# Patient Record
Sex: Female | Born: 1965 | Race: White | Hispanic: No | Marital: Married | Smoking: Never smoker
Health system: Southern US, Community
[De-identification: ages and names within clinical notes are randomized; demographics above are authoritative.]

---

## 2008-01-21 ENCOUNTER — Encounter: Admission: RE | Admit: 2008-01-21 | Discharge: 2008-01-21 | Payer: Self-pay | Admitting: Obstetrics and Gynecology

## 2008-01-31 ENCOUNTER — Encounter: Admission: RE | Admit: 2008-01-31 | Discharge: 2008-01-31 | Payer: Self-pay | Admitting: Family Medicine

## 2012-12-12 ENCOUNTER — Ambulatory Visit: Payer: BC Managed Care – PPO | Admitting: Family Medicine

## 2012-12-12 ENCOUNTER — Ambulatory Visit: Payer: BC Managed Care – PPO

## 2012-12-12 VITALS — BP 130/82 | HR 100 | Temp 98.7°F | Resp 18 | Ht 64.25 in | Wt 188.0 lb

## 2012-12-12 DIAGNOSIS — S93601A Unspecified sprain of right foot, initial encounter: Secondary | ICD-10-CM

## 2012-12-12 DIAGNOSIS — S8990XA Unspecified injury of unspecified lower leg, initial encounter: Secondary | ICD-10-CM

## 2012-12-12 DIAGNOSIS — S93609A Unspecified sprain of unspecified foot, initial encounter: Secondary | ICD-10-CM

## 2012-12-12 DIAGNOSIS — S99921A Unspecified injury of right foot, initial encounter: Secondary | ICD-10-CM

## 2012-12-12 MED ORDER — IBUPROFEN 600 MG PO TABS: 600.0000 mg | ORAL_TABLET | Freq: Three times a day (TID) | ORAL | Status: DC | PRN

## 2012-12-12 MED ORDER — TRAMADOL HCL 50 MG PO TABS: 50.0000 mg | ORAL_TABLET | Freq: Three times a day (TID) | ORAL | Status: DC | PRN

## 2012-12-12 NOTE — Patient Instructions (Signed)
Foot Sprain °The muscles and cord like structures which attach muscle to bone (tendons) that surround the feet are made up of units. A foot sprain can occur at the weakest spot in any of these units. This condition is most often caused by injury to or overuse of the foot, as from playing contact sports, or aggravating a previous injury, or from poor conditioning, or obesity. °SYMPTOMS °· Pain with movement of the foot. °· Tenderness and swelling at the injury site. °· Loss of strength is present in moderate or severe sprains. °THE THREE GRADES OR SEVERITY OF FOOT SPRAIN ARE: °· Mild (Grade I): Slightly pulled muscle without tearing of muscle or tendon fibers or loss of strength. °· Moderate (Grade II): Tearing of fibers in a muscle, tendon, or at the attachment to bone, with small decrease in strength. °· Severe (Grade III): Rupture of the muscle-tendon-bone attachment, with separation of fibers. Severe sprain requires surgical repair. Often repeating (chronic) sprains are caused by overuse. Sudden (acute) sprains are caused by direct injury or over-use. °DIAGNOSIS  °Diagnosis of this condition is usually by your own observation. If problems continue, a caregiver may be required for further evaluation and treatment. X-rays may be required to make sure there are not breaks in the bones (fractures) present. Continued problems may require physical therapy for treatment. °PREVENTION °· Use strength and conditioning exercises appropriate for your sport. °· Warm up properly prior to working out. °· Use athletic shoes that are made for the sport you are participating in. °· Allow adequate time for healing. Early return to activities makes repeat injury more likely, and can lead to an unstable arthritic foot that can result in prolonged disability. Mild sprains generally heal in 3 to 10 days, with moderate and severe sprains taking 2 to 10 weeks. Your caregiver can help you determine the proper time required for  healing. °HOME CARE INSTRUCTIONS  °· Apply ice to the injury for 15-20 minutes, 3-4 times per day. Put the ice in a plastic bag and place a towel between the bag of ice and your skin. °· An elastic wrap (like an Ace bandage) may be used to keep swelling down. °· Keep foot above the level of the heart, or at least raised on a footstool, when swelling and pain are present. °· Try to avoid use other than gentle range of motion while the foot is painful. Do not resume use until instructed by your caregiver. Then begin use gradually, not increasing use to the point of pain. If pain does develop, decrease use and continue the above measures, gradually increasing activities that do not cause discomfort, until you gradually achieve normal use. °· Use crutches if and as instructed, and for the length of time instructed. °· Keep injured foot and ankle wrapped between treatments. °· Massage foot and ankle for comfort and to keep swelling down. Massage from the toes up towards the knee. °· Only take over-the-counter or prescription medicines for pain, discomfort, or fever as directed by your caregiver. °SEEK IMMEDIATE MEDICAL CARE IF:  °· Your pain and swelling increase, or pain is not controlled with medications. °· You have loss of feeling in your foot or your foot turns cold or blue. °· You develop new, unexplained symptoms, or an increase of the symptoms that brought you to your caregiver. °MAKE SURE YOU:  °· Understand these instructions. °· Will watch your condition. °· Will get help right away if you are not doing well or get worse. °Document Released:   08/26/2001 Document Revised: 05/29/2011 Document Reviewed: 10/24/2007 °[employer]® Patient Information ©2014 [employer]. ° °

## 2012-12-12 NOTE — Progress Notes (Signed)
  Subjective:    Patient ID: Sarah Bradshaw, female    DOB: [birth year], 47 y.o.   MRN: [id number]   Chief Complaint  Patient presents with  . Foot Injury    right foot injury yesterday     HPI  Ran into bed frame yesterday with right toes.  Having a lot of pain over 2nd - 4th toe.  Was hobbling yesterday and actually crawling last night - still hobbling but can't put any pressure on the ball of her foot. Has been elevating foot but no nsaids or ice.  History reviewed. No pertinent past medical history. No current outpatient prescriptions on file prior to visit.   No current facility-administered medications on file prior to visit.   No Known Allergies  Review of Systems  Constitutional: Positive for activity change. Negative for fever, chills and unexpected weight change.  Musculoskeletal: Positive for joint swelling, arthralgias and gait problem. Negative for myalgias and back pain.  Skin: Positive for color change. Negative for rash.  Neurological: Negative for weakness and numbness.  Psychiatric/Behavioral: Positive for sleep disturbance.      BP 130/82  Pulse 100  Temp(Src) 98.7 F (37.1 C) (Oral)  Resp 18  Ht 5' 4.25" (1.632 m)  Wt 188 lb (85.276 kg)  BMI 32.02 kg/m2  SpO2 100%  LMP 12/02/2012 Objective:   Physical Exam  Constitutional: She is oriented to person, place, and time. She appears well-developed and well-nourished. No distress.  HENT:  Head: Normocephalic and atraumatic.  Right Ear: External ear normal.  Eyes: Conjunctivae are normal. No scleral icterus.  Cardiovascular:  Pulses:      Dorsalis pedis pulses are 2+ on the right side.       Posterior tibial pulses are 2+ on the right side.  Pulmonary/Chest: Effort normal.  Musculoskeletal:       Right ankle: Normal. She exhibits normal range of motion and no swelling. No tenderness. No head of 5th metatarsal and no proximal fibula tenderness found. Achilles tendon normal.       Right foot: She exhibits  decreased range of motion, tenderness, bony tenderness and swelling. She exhibits normal capillary refill, no crepitus, no deformity and no laceration.       Feet:  Neurological: She is alert and oriented to person, place, and time.  Skin: Skin is warm and dry. She is not diaphoretic. No erythema.  Psychiatric: She has a normal mood and affect. Her behavior is normal.   [facility] reading (PRIMARY) by  Dr. Clelia Croft.    Right foot xray: No acute bony abnormality seen. Assessment & Plan:  Foot injury, right, initial encounter - Plan: DG Foot Complete Right  Foot sprain, right, initial encounter RICE x 2-3d, wear post-op shoe x 1 wk, then transition to shoe with firm thick sole. RTC if worsening or still having sig pain/discomfort after 7-10d. Meds ordered this encounter  Medications  . ibuprofen (ADVIL,MOTRIN) 600 MG tablet    Sig: Take 1 tablet (600 mg total) by mouth every 8 (eight) hours as needed for pain.    Dispense:  30 tablet    Refill:  0  . traMADol (ULTRAM) 50 MG tablet    Sig: Take 1 tablet (50 mg total) by mouth every 8 (eight) hours as needed for pain.    Dispense:  30 tablet    Refill:  0

## 2015-03-22 ENCOUNTER — Emergency Department (HOSPITAL_COMMUNITY)
Admission: EM | Admit: 2015-03-22 | Discharge: 2015-03-22 | Disposition: A | Discharge status: Home / Self Care | Payer: 59 | Attending: Emergency Medicine | Admitting: Emergency Medicine

## 2015-03-22 ENCOUNTER — Emergency Department (HOSPITAL_COMMUNITY): Payer: 59

## 2015-03-22 DIAGNOSIS — R1030 Lower abdominal pain, unspecified: Secondary | ICD-10-CM | POA: Diagnosis not present

## 2015-03-22 DIAGNOSIS — N939 Abnormal uterine and vaginal bleeding, unspecified: Secondary | ICD-10-CM | POA: Insufficient documentation

## 2015-03-22 LAB — COMPREHENSIVE METABOLIC PANEL
ALT: 13 U/L — ABNORMAL LOW (ref 14–54)
AST: 17 U/L (ref 15–41)
Albumin: 4.2 g/dL (ref 3.5–5.0)
Alkaline Phosphatase: 120 U/L (ref 38–126)
Anion gap: 11 (ref 5–15)
BUN: 11 mg/dL (ref 6–20)
CHLORIDE: 103 mmol/L (ref 101–111)
CO2: 22 mmol/L (ref 22–32)
Calcium: 8.8 mg/dL — ABNORMAL LOW (ref 8.9–10.3)
Creatinine, Ser: 0.66 mg/dL (ref 0.44–1.00)
Glucose, Bld: 108 mg/dL — ABNORMAL HIGH (ref 65–99)
POTASSIUM: 3.8 mmol/L (ref 3.5–5.1)
SODIUM: 136 mmol/L (ref 135–145)
Total Bilirubin: 0.9 mg/dL (ref 0.3–1.2)
Total Protein: 7.7 g/dL (ref 6.5–8.1)

## 2015-03-22 LAB — CBC
HEMATOCRIT: 40.4 % (ref 36.0–46.0)
Hemoglobin: 13.9 g/dL (ref 12.0–15.0)
MCH: 30.8 pg (ref 26.0–34.0)
MCHC: 34.4 g/dL (ref 30.0–36.0)
MCV: 89.6 fL (ref 78.0–100.0)
Platelets: 286 10*3/uL (ref 150–400)
RBC: 4.51 MIL/uL (ref 3.87–5.11)
RDW: 13.1 % (ref 11.5–15.5)
WBC: 15 10*3/uL — AB (ref 4.0–10.5)

## 2015-03-22 LAB — WET PREP, GENITAL
Clue Cells Wet Prep HPF POC: NONE SEEN
Sperm: NONE SEEN
TRICH WET PREP: NONE SEEN
YEAST WET PREP: NONE SEEN

## 2015-03-22 LAB — URINALYSIS, ROUTINE W REFLEX MICROSCOPIC
GLUCOSE, UA: NEGATIVE mg/dL
Ketones, ur: NEGATIVE mg/dL
Nitrite: NEGATIVE
PH: 7.5 (ref 5.0–8.0)
Protein, ur: NEGATIVE mg/dL
Specific Gravity, Urine: 1.023 (ref 1.005–1.030)

## 2015-03-22 LAB — URINE MICROSCOPIC-ADD ON

## 2015-03-22 LAB — LIPASE, BLOOD: LIPASE: 26 U/L (ref 11–51)

## 2015-03-22 LAB — PROTIME-INR
INR: 1.04 (ref 0.00–1.49)
PROTHROMBIN TIME: 13.8 s (ref 11.6–15.2)

## 2015-03-22 LAB — APTT: APTT: 28 s (ref 24–37)

## 2015-03-22 LAB — I-STAT BETA HCG BLOOD, ED (MC, WL, AP ONLY): I-stat hCG, quantitative: <5 m[IU]/mL (ref <5)

## 2015-03-22 MED ORDER — OXYCODONE-ACETAMINOPHEN 5-325 MG PO TABS
1.0000 | ORAL_TABLET | ORAL | Status: AC | PRN
Start: 2015-03-22 — End: ?

## 2015-03-22 MED ORDER — ONDANSETRON 4 MG PO TBDP: 4.0000 mg | ORAL_TABLET | Freq: Three times a day (TID) | ORAL | Status: AC | PRN

## 2015-03-22 MED ORDER — HYDROMORPHONE HCL 1 MG/ML IJ SOLN
1.0000 mg | Freq: Once | INTRAMUSCULAR | Status: AC
  Administered 2015-03-22: 1 mg via INTRAVENOUS
  Filled 2015-03-22: qty 1

## 2015-03-22 MED ORDER — IBUPROFEN 800 MG PO TABS: 800.0000 mg | ORAL_TABLET | Freq: Three times a day (TID) | ORAL | Status: AC

## 2015-03-22 MED ORDER — MORPHINE SULFATE (PF) 4 MG/ML IV SOLN
4.0000 mg | Freq: Once | INTRAVENOUS | Status: AC
  Administered 2015-03-22: 4 mg via INTRAVENOUS
  Filled 2015-03-22: qty 1

## 2015-03-22 NOTE — ED Notes (Signed)
Pt c/o lower abdominal cramping onset 2 days ago, states she has been on her menstrual cycle x 2 weeks, heavy bleeding, changing heavy tampon every 0.5-1 hour, blood clots.

## 2015-03-22 NOTE — Discharge Instructions (Signed)
Take the prescribed medication as directed. Follow-up with an OB-GYN as soon as possible-- either call your former OB-GYN or [facility]. Return to the ED for new or worsening symptoms.

## 2015-03-22 NOTE — ED Provider Notes (Signed)
CSN: [id number]     Arrival date & time 03/22/15  0930 History   First MD Initiated Contact with Patient 03/22/15 1019     Chief Complaint  Patient presents with  . Vaginal Bleeding     (Consider location/radiation/quality/duration/timing/severity/associated sxs/prior Treatment) Patient is a 50 y.o. female presenting with vaginal bleeding. The history is provided by the patient and medical records.  Vaginal Bleeding Associated symptoms: abdominal pain     50 year old female with no significant past medical history presenting to the ED for vaginal bleeding. Patient states she had her menstrual cycle last week which was normal, however began having very heavy bleeding for the past 48 hours. She states she isn't changing her tampon every 1-2 hours and has had dark red blood clots in her bleeding as well.  She states she is also having very severe abdominal cramping which she states feels like contractions.  She denies nausea, vomiting, diarrhea.  States this is very abdominal for her to have irregular vaginal bleeding like this-- states her cycles are usually very regular, lasting 5-7 days with moderate flow.  States no hx of heavy vaginal bleeding or clotting disorder.  No hx of endometriosis in family.  States she thinks she was told she had uterine fibroids as a teenager.  No other OB-GYN history. Patient is established with OB-GYN in [facility], .  VSS.  No past medical history on file. No past surgical history on file. Family History  Problem Relation Age of Onset  . Hypertension Mother   . Heart disease Father   . Heart disease Brother    Social History  Substance Use Topics  . Smoking status: Never Smoker   . Smokeless tobacco: Not on file  . Alcohol Use: No   OB History    No data available     Review of Systems  Gastrointestinal: Positive for abdominal pain.  Genitourinary: Positive for vaginal bleeding.  All other systems reviewed and are negative.     Allergies   Review of patient's allergies indicates no known allergies.  Home Medications   Prior to Admission medications   Medication Sig Start Date End Date Taking? Authorizing Provider  Ibuprofen (MIDOL) 200 MG CAPS Take 400 mg by mouth every 6 (six) hours as needed (cramps/pain).   Yes Historical Provider, MD   BP 153/114 mmHg  Pulse 117  Temp(Src) 98.1 F (36.7 C) (Oral)  Resp 16  SpO2 99%   Physical Exam  Constitutional: She is oriented to person, place, and time. She appears well-developed and well-nourished.  HENT:  Head: Normocephalic and atraumatic.  Mouth/Throat: Oropharynx is clear and moist.  Eyes: Conjunctivae and EOM are normal. Pupils are equal, round, and reactive to light.  Neck: Normal range of motion. Neck supple.  Cardiovascular: Normal rate, regular rhythm and normal heart sounds.   Pulmonary/Chest: Effort normal and breath sounds normal. No respiratory distress. She has no wheezes.  Abdominal: Soft. Bowel sounds are normal. There is tenderness in the suprapubic area. There is no CVA tenderness.  TTP in suprapubic region, voluntary guarding noted  Genitourinary: There is no lesion on the right labia. There is no lesion on the left labia. There is bleeding in the vagina. No foreign body around the vagina. No vaginal discharge found.  Normal female external genitalia without visible lesions; moderate amount of vaginal bleeding with very small clots noted; cervical os closed; no adnexal or CMT  Musculoskeletal: Normal range of motion.  Neurological: She is alert and oriented to  person, place, and time.  Skin: Skin is warm and dry.  Psychiatric: She has a normal mood and affect.  Nursing note and vitals reviewed.   ED Course  Procedures (including critical care time) Labs Review Labs Reviewed  WET PREP, GENITAL - Abnormal; Notable for the following:    WBC, Wet Prep HPF POC FEW (*)    All other components within normal limits  COMPREHENSIVE METABOLIC PANEL -  Abnormal; Notable for the following:    Glucose, Bld 108 (*)    Calcium 8.8 (*)    ALT 13 (*)    All other components within normal limits  CBC - Abnormal; Notable for the following:    WBC 15.0 (*)    All other components within normal limits  URINALYSIS, ROUTINE W REFLEX MICROSCOPIC (NOT AT [facility]) - Abnormal; Notable for the following:    Color, Urine AMBER (*)    APPearance CLOUDY (*)    Hgb urine dipstick LARGE (*)    Bilirubin Urine SMALL (*)    Leukocytes, UA SMALL (*)    All other components within normal limits  URINE MICROSCOPIC-ADD ON - Abnormal; Notable for the following:    Squamous Epithelial / LPF 0-5 (*)    Bacteria, UA RARE (*)    All other components within normal limits  LIPASE, BLOOD  PROTIME-INR  APTT  I-STAT BETA HCG BLOOD, ED ([facility], [facility], [facility] ONLY)  GC/CHLAMYDIA PROBE AMP ([place]) NOT AT [facility]    Imaging Review [place] Transvaginal Non-ob  03/22/2015  CLINICAL DATA:  Heavy bleeding and pelvic pain since yesterday. EXAM: TRANSABDOMINAL AND TRANSVAGINAL ULTRASOUND OF PELVIS DOPPLER ULTRASOUND OF OVARIES TECHNIQUE: Both transabdominal and transvaginal ultrasound examinations of the pelvis were performed. Transabdominal technique was performed for global imaging of the pelvis including uterus, ovaries, adnexal regions, and pelvic cul-de-sac. It was necessary to proceed with endovaginal exam following the transabdominal exam to visualize the endometrium. Color and duplex Doppler ultrasound was utilized to evaluate blood flow to the ovaries. COMPARISON:  None. FINDINGS: Uterus Measurements: 11.9 x 6 x 6 x 7.3 cm. Myometrium is heterogeneous raising the question of adenomyosis. No evidence for focal fibroid. Endometrium Thickness: Heterogeneous and 15 mm in thickness. No focal abnormality visualized. Right ovary Measurements: Poorly visualized but approximately 3.1 x 2.2 x 2.0 cm. No adnexal mass. Left ovary Measurements: Not definitely visualized. Pulsed Doppler evaluation of  ovaries demonstrates normal low resistance arterial and venous waveforms in the right ovary. Left ovary not well seen. Other findings Trace intraperitoneal free fluid. IMPRESSION: Thickened heterogeneous endometrium may be related to blood products. No evidence for adnexal mass with poor visualization of left ovary. Electronically Signed   By: Kennith CenterEric  Mansell M.D.   On: 03/22/2015 13:25   [place] Pelvis Complete  03/22/2015  CLINICAL DATA:  Heavy bleeding and pelvic pain since yesterday. EXAM: TRANSABDOMINAL AND TRANSVAGINAL ULTRASOUND OF PELVIS DOPPLER ULTRASOUND OF OVARIES TECHNIQUE: Both transabdominal and transvaginal ultrasound examinations of the pelvis were performed. Transabdominal technique was performed for global imaging of the pelvis including uterus, ovaries, adnexal regions, and pelvic cul-de-sac. It was necessary to proceed with endovaginal exam following the transabdominal exam to visualize the endometrium. Color and duplex Doppler ultrasound was utilized to evaluate blood flow to the ovaries. COMPARISON:  None. FINDINGS: Uterus Measurements: 11.9 x 6 x 6 x 7.3 cm. Myometrium is heterogeneous raising the question of adenomyosis. No evidence for focal fibroid. Endometrium Thickness: Heterogeneous and 15 mm in thickness. No focal abnormality visualized. Right ovary Measurements: Poorly  visualized but approximately 3.1 x 2.2 x 2.0 cm. No adnexal mass. Left ovary Measurements: Not definitely visualized. Pulsed Doppler evaluation of ovaries demonstrates normal low resistance arterial and venous waveforms in the right ovary. Left ovary not well seen. Other findings Trace intraperitoneal free fluid. IMPRESSION: Thickened heterogeneous endometrium may be related to blood products. No evidence for adnexal mass with poor visualization of left ovary. Electronically Signed   By: Kennith Center M.D.   On: 03/22/2015 13:25   [place] Art/ven Flow Abd Pelv Doppler  03/22/2015  CLINICAL DATA:  Heavy bleeding and pelvic pain  since yesterday. EXAM: TRANSABDOMINAL AND TRANSVAGINAL ULTRASOUND OF PELVIS DOPPLER ULTRASOUND OF OVARIES TECHNIQUE: Both transabdominal and transvaginal ultrasound examinations of the pelvis were performed. Transabdominal technique was performed for global imaging of the pelvis including uterus, ovaries, adnexal regions, and pelvic cul-de-sac. It was necessary to proceed with endovaginal exam following the transabdominal exam to visualize the endometrium. Color and duplex Doppler ultrasound was utilized to evaluate blood flow to the ovaries. COMPARISON:  None. FINDINGS: Uterus Measurements: 11.9 x 6 x 6 x 7.3 cm. Myometrium is heterogeneous raising the question of adenomyosis. No evidence for focal fibroid. Endometrium Thickness: Heterogeneous and 15 mm in thickness. No focal abnormality visualized. Right ovary Measurements: Poorly visualized but approximately 3.1 x 2.2 x 2.0 cm. No adnexal mass. Left ovary Measurements: Not definitely visualized. Pulsed Doppler evaluation of ovaries demonstrates normal low resistance arterial and venous waveforms in the right ovary. Left ovary not well seen. Other findings Trace intraperitoneal free fluid. IMPRESSION: Thickened heterogeneous endometrium may be related to blood products. No evidence for adnexal mass with poor visualization of left ovary. Electronically Signed   By: Kennith Center M.D.   On: 03/22/2015 13:25   I have personally reviewed and evaluated these images and lab results as part of my medical decision-making.   EKG Interpretation None      MDM   Final diagnoses:  Vaginal bleeding   50 year old female here with heavy vaginal bleeding. Menstrual cycle last week, however bleeding continued. No history of the same, but believes she was told she had uterine fibroids as a teenager. Patient is afebrile, nontoxic. On exam she has moderate amount of vaginal bleeding with small clots intermixed. Her cervical os is closed. She has no adnexal or cervical  motion tenderness. She does have suprapubic tenderness on external exam. Her lab work is reassuring, H&H stable. Coags are within normal limits. Given new onset of abnormal menstrual cycles, ultrasound was obtained which is concerning for adenomyosis.  There are no noted masses or ovarian abnormalities. After pain medication here in the emergency department, patient is much more comfortable. She states she does have some mild cramping still, however this is tolerable. Patient remains hemodynamically stable. Will discharge home with OB/GYN follow-up-- instructed she may see her former OB-GYN or may be see at [facility] hospital.  Rx Percocet, Motrin, and Zofran.  Discussed plan with patient, he/she acknowledged understanding and agreed with plan of care.  Return precautions given for new or worsening symptoms.  Garlon Hatchet, PA-C 03/22/15 1537  Leta Baptist, MD 03/23/15 [phone]

## 2015-03-23 LAB — GC/CHLAMYDIA PROBE AMP (~~LOC~~) NOT AT ARMC
CHLAMYDIA, DNA PROBE: NEGATIVE
NEISSERIA GONORRHEA: NEGATIVE

## 2015-04-08 ENCOUNTER — Ambulatory Visit (INDEPENDENT_AMBULATORY_CARE_PROVIDER_SITE_OTHER): Payer: Self-pay | Admitting: Obstetrics & Gynecology

## 2015-04-08 ENCOUNTER — Other Ambulatory Visit (HOSPITAL_COMMUNITY)
Admission: RE | Admit: 2015-04-08 | Discharge: 2015-04-08 | Disposition: A | Discharge status: Home / Self Care | Payer: 59 | Source: Ambulatory Visit | Attending: Obstetrics & Gynecology | Admitting: Obstetrics & Gynecology

## 2015-04-08 ENCOUNTER — Encounter: Payer: Self-pay | Admitting: Obstetrics & Gynecology

## 2015-04-08 VITALS — BP 138/95 | HR 91 | Temp 98.0°F | Wt 218.6 lb

## 2015-04-08 DIAGNOSIS — N939 Abnormal uterine and vaginal bleeding, unspecified: Secondary | ICD-10-CM

## 2015-04-08 DIAGNOSIS — Z124 Encounter for screening for malignant neoplasm of cervix: Secondary | ICD-10-CM

## 2015-04-08 DIAGNOSIS — N945 Secondary dysmenorrhea: Secondary | ICD-10-CM | POA: Insufficient documentation

## 2015-04-08 DIAGNOSIS — Z1151 Encounter for screening for human papillomavirus (HPV): Secondary | ICD-10-CM

## 2015-04-08 DIAGNOSIS — Z01419 Encounter for gynecological examination (general) (routine) without abnormal findings: Secondary | ICD-10-CM

## 2015-04-08 MED ORDER — NORETHIN-ETH ESTRAD-FE BIPHAS 1 MG-10 MCG / 10 MCG PO TABS: 1.0000 | ORAL_TABLET | Freq: Every day | ORAL | Status: DC

## 2015-04-08 NOTE — Progress Notes (Signed)
Patient ID: Sarah Bradshaw, female   DOB: 09-03-1965, 50 y.o.   MRN: [id number] History:  50 y.o. [id number] LNMP 1 1/2 year prev.  LMP 02/2015. Menses have been heavy and painful for 1 1/2 years.  Cycles occur every 4-5 weeks.  Cycles last 7 days.  Getting worse. She had a cycle for 3 weeks in Dec.  Pt reports using a super tampon and a pad at times.  On regular days pt reports using tampon and changing every 2 hours or so.  Pt reports passing of clots that are larger than a quarter.     Took OCPs prev with no ill effects.  Nonsmoker.  Told in the ED that she had endometriosis.  Pain worse with passage of large clots. Did not have pain prior to 1 1/2 years prev.  The following portions of the patient's history were reviewed and updated as appropriate: allergies, current medications, past family history, past medical history, past social history, past surgical history and problem list.  Social History   Social History  . Marital Status: Widowed    Spouse Name: N/A  . Number of Children: N/A  . Years of Education: N/A   Occupational History  . Not on file.   Social History Main Topics  . Smoking status: Never Smoker   . Smokeless tobacco: Not on file  . Alcohol Use: No  . Drug Use: No  . Sexual Activity: Not on file   Other Topics Concern  . Not on file   Social History Narrative      Review of Systems:  Pertinent items are noted in HPI.  Objective:  Physical Exam Blood pressure 138/95, pulse 91, temperature 98 F (36.7 C), weight 218 lb 9.6 oz (99.156 kg), last menstrual period 03/14/2015. Gen: NAD Abd: Soft, nontender and nondistended Pelvic: Normal appearing external genitalia; normal appearing vaginal mucosa and cervix.  No blood in vault. Normal discharge.  Small uterus, no other palpable masses, no uterine or adnexal tenderness  The indications for endometrial biopsy were reviewed.   Risks of the biopsy including cramping, bleeding, infection, uterine perforation, inadequate  specimen and need for additional procedures  were discussed. The patient states she understands and agrees to undergo procedure today. Consent was signed. Time out was performed. Urine HCG was negative. A sterile speculum was placed in the patient's vagina and the cervix was prepped with Betadine. A single-toothed tenaculum was placed on the anterior lip of the cervix to stabilize it. The 3 mm pipelle was introduced into the endometrial cavity without difficulty to a depth of 10cm, and a moderate amount of tissue was obtained and sent to pathology. The instruments were removed from the patient's vagina. Minimal bleeding from the cervix was noted.  The patient tolerated the procedure well.   Labs and Imaging US Transvaginal Non-ob  03/22/2015  CLINICAL DATA:  Heavy bleeding and pelvic pain since yesterday. EXAM: TRANSABDOMINAL AND TRANSVAGINAL ULTRASOUND OF PELVIS DOPPLER ULTRASOUND OF OVARIES TECHNIQUE: Both transabdominal and transvaginal ultrasound examinations of the pelvis were performed. Transabdominal technique was performed for global imaging of the pelvis including uterus, ovaries, adnexal regions, and pelvic cul-de-sac. It was necessary to proceed with endovaginal exam following the transabdominal exam to visualize the endometrium. Color and duplex Doppler ultrasound was utilized to evaluate blood flow to the ovaries. COMPARISON:  None. FINDINGS: Uterus Measurements: 11.9 x 6 x 6 x 7.3 cm. Myometrium is heterogeneous raising the question of adenomyosis. No evidence for focal fibroid. Endometrium Thickness: Heterogeneous and 15  mm in thickness. No focal abnormality visualized. Right ovary Measurements: Poorly visualized but approximately 3.1 x 2.2 x 2.0 cm. No adnexal mass. Left ovary Measurements: Not definitely visualized. Pulsed Doppler evaluation of ovaries demonstrates normal low resistance arterial and venous waveforms in the right ovary. Left ovary not well seen. Other findings Trace  intraperitoneal free fluid. IMPRESSION: Thickened heterogeneous endometrium may be related to blood products. No evidence for adnexal mass with poor visualization of left ovary. Electronically Signed   By: Kennith Center M.D.   On: 03/22/2015 13:25   US Pelvis Complete  03/22/2015  CLINICAL DATA:  Heavy bleeding and pelvic pain since yesterday. EXAM: TRANSABDOMINAL AND TRANSVAGINAL ULTRASOUND OF PELVIS DOPPLER ULTRASOUND OF OVARIES TECHNIQUE: Both transabdominal and transvaginal ultrasound examinations of the pelvis were performed. Transabdominal technique was performed for global imaging of the pelvis including uterus, ovaries, adnexal regions, and pelvic cul-de-sac. It was necessary to proceed with endovaginal exam following the transabdominal exam to visualize the endometrium. Color and duplex Doppler ultrasound was utilized to evaluate blood flow to the ovaries. COMPARISON:  None. FINDINGS: Uterus Measurements: 11.9 x 6 x 6 x 7.3 cm. Myometrium is heterogeneous raising the question of adenomyosis. No evidence for focal fibroid. Endometrium Thickness: Heterogeneous and 15 mm in thickness. No focal abnormality visualized. Right ovary Measurements: Poorly visualized but approximately 3.1 x 2.2 x 2.0 cm. No adnexal mass. Left ovary Measurements: Not definitely visualized. Pulsed Doppler evaluation of ovaries demonstrates normal low resistance arterial and venous waveforms in the right ovary. Left ovary not well seen. Other findings Trace intraperitoneal free fluid. IMPRESSION: Thickened heterogeneous endometrium may be related to blood products. No evidence for adnexal mass with poor visualization of left ovary. Electronically Signed   By: Kennith Center M.D.   On: 03/22/2015 13:25   [place] Art/ven Flow Abd Pelv Doppler  03/22/2015  CLINICAL DATA:  Heavy bleeding and pelvic pain since yesterday. EXAM: TRANSABDOMINAL AND TRANSVAGINAL ULTRASOUND OF PELVIS DOPPLER ULTRASOUND OF OVARIES TECHNIQUE: Both transabdominal and  transvaginal ultrasound examinations of the pelvis were performed. Transabdominal technique was performed for global imaging of the pelvis including uterus, ovaries, adnexal regions, and pelvic cul-de-sac. It was necessary to proceed with endovaginal exam following the transabdominal exam to visualize the endometrium. Color and duplex Doppler ultrasound was utilized to evaluate blood flow to the ovaries. COMPARISON:  None. FINDINGS: Uterus Measurements: 11.9 x 6 x 6 x 7.3 cm. Myometrium is heterogeneous raising the question of adenomyosis. No evidence for focal fibroid. Endometrium Thickness: Heterogeneous and 15 mm in thickness. No focal abnormality visualized. Right ovary Measurements: Poorly visualized but approximately 3.1 x 2.2 x 2.0 cm. No adnexal mass. Left ovary Measurements: Not definitely visualized. Pulsed Doppler evaluation of ovaries demonstrates normal low resistance arterial and venous waveforms in the right ovary. Left ovary not well seen. Other findings Trace intraperitoneal free fluid. IMPRESSION: Thickened heterogeneous endometrium may be related to blood products. No evidence for adnexal mass with poor visualization of left ovary. Electronically Signed   By: Kennith Center M.D.   On: 03/22/2015 13:25    Assessment & Plan:  AUB- thought secondary to perimenopause.  Need to r/o endo ca. S/p endo bx secopndary dysmenorrhea Routine post-procedure instructions were given to the patient. The patient will follow up to review the results and for further management.     F/u PAP F/u surg PATH Loestrin 1/24 1 po q day F/u in 3 months or sooner prn   Nevah Dalal L. Harraway-Smith, M.D., [facility]

## 2015-04-08 NOTE — Patient Instructions (Addendum)
Perimenopause Perimenopause is the time when your body begins to move into the menopause (no menstrual period for 12 straight months). It is a natural process. Perimenopause can begin 2-8 years before the menopause and usually lasts for 1 year after the menopause. During this time, your ovaries may or may not produce an egg. The ovaries vary in their production of estrogen and progesterone hormones each month. This can cause irregular menstrual periods, difficulty getting pregnant, vaginal bleeding between periods, and uncomfortable symptoms. CAUSES  Irregular production of the ovarian hormones, estrogen and progesterone, and not ovulating every month.  Other causes include:  Tumor of the pituitary gland in the brain.  Medical disease that affects the ovaries.  Radiation treatment.  Chemotherapy.  Unknown causes.  Heavy smoking and excessive alcohol intake can bring on perimenopause sooner. SIGNS AND SYMPTOMS   Hot flashes.  Night sweats.  Irregular menstrual periods.  Decreased sex drive.  Vaginal dryness.  Headaches.  Mood swings.  Depression.  Memory problems.  Irritability.  Tiredness.  Weight gain.  Trouble getting pregnant.  The beginning of losing bone cells (osteoporosis).  The beginning of hardening of the arteries (atherosclerosis). DIAGNOSIS  Your health care provider will make a diagnosis by analyzing your age, menstrual history, and symptoms. He or she will do a physical exam and note any changes in your body, especially your female organs. Female hormone tests may or may not be helpful depending on the amount of female hormones you produce and when you produce them. However, other hormone tests may be helpful to rule out other problems. TREATMENT  In some cases, no treatment is needed. The decision on whether treatment is necessary during the perimenopause should be made by you and your health care provider based on how the symptoms are affecting you  and your lifestyle. Various treatments are available, such as:  Treating individual symptoms with a specific medicine for that symptom.  Herbal medicines that can help specific symptoms.  Counseling.  Group therapy. HOME CARE INSTRUCTIONS   Keep track of your menstrual periods (when they occur, how heavy they are, how long between periods, and how long they last) as well as your symptoms and when they started.  Only take over-the-counter or prescription medicines as directed by your health care provider.  Sleep and rest.  Exercise.  Eat a diet that contains calcium (good for your bones) and soy (acts like the estrogen hormone).  Do not smoke.  Avoid alcoholic beverages.  Take vitamin supplements as recommended by your health care provider. Taking vitamin E may help in certain cases.  Take calcium and vitamin D supplements to help prevent bone loss.  Group therapy is sometimes helpful.  Acupuncture may help in some cases. SEEK MEDICAL CARE IF:   You have questions about any symptoms you are having.  You need a referral to a specialist (gynecologist, psychiatrist, or psychologist). SEEK IMMEDIATE MEDICAL CARE IF:   You have vaginal bleeding.  Your period lasts longer than 8 days.  Your periods are recurring sooner than 21 days.  You have bleeding after intercourse.  You have severe depression.  You have pain when you urinate.  You have severe headaches.  You have vision problems.   This information is not intended to replace advice given to you by your health care provider. Make sure you discuss any questions you have with your health care provider.   Document Released: 04/13/2004 Document Revised: 03/27/2014 Document Reviewed: 10/03/2012 Elsevier Interactive Patient Education 2016 [employer]    Endometrial Biopsy, Care After Refer to this sheet in the next few weeks. These instructions provide you with information on caring for yourself after your  procedure. Your health care provider may also give you more specific instructions. Your treatment has been planned according to current medical practices, but problems sometimes occur. Call your health care provider if you have any problems or questions after your procedure. WHAT TO EXPECT AFTER THE PROCEDURE After your procedure, it is typical to have the following:  You may have mild cramping and a small amount of vaginal bleeding for a few days after the procedure. This is normal. HOME CARE INSTRUCTIONS  Only take over-the-counter or prescription medicine as directed by your health care provider.  Do not douche, use tampons, or have sexual intercourse until your health care provider approves.  Follow your health care provider's instructions regarding any activity restrictions, such as strenuous exercise or heavy lifting. SEEK MEDICAL CARE IF:  You have heavy bleeding or bleeding longer than 2 days after the procedure.  You have bad smelling drainage from your vagina.  You have a fever and chills.  Youhave severe lower stomach (abdominal) pain. SEEK IMMEDIATE MEDICAL CARE IF:  You have severe cramps in your stomach or back.  You pass large blood clots.  Your bleeding increases.  You become weak or lightheaded, or you pass out.   This information is not intended to replace advice given to you by your health care provider. Make sure you discuss any questions you have with your health care provider.   Document Released: 12/25/2012 Document Reviewed: 12/25/2012 Elsevier Interactive Patient Education 2016 [employer] Inc[employer]  Abnormal Uterine Bleeding Abnormal uterine bleeding can affect women at various stages in life, including teenagers, women in their reproductive years, pregnant women, and women who have reached menopause. Several kinds of uterine bleeding are considered abnormal, including:  Bleeding or spotting between periods.   Bleeding after sexual intercourse.    Bleeding that is heavier or more than normal.   Periods that last longer than usual.  Bleeding after menopause.  Many cases of abnormal uterine bleeding are minor and simple to treat, while others are more serious. Any type of abnormal bleeding should be evaluated by your health care provider. Treatment will depend on the cause of the bleeding. HOME CARE INSTRUCTIONS Monitor your condition for any changes. The following actions may help to alleviate any discomfort you are experiencing:  Avoid the use of tampons and douches as directed by your health care provider.  Change your pads frequently. You should get regular pelvic exams and Pap tests. Keep all follow-up appointments for diagnostic tests as directed by your health care provider.  SEEK MEDICAL CARE IF:   Your bleeding lasts more than 1 week.   You feel dizzy at times.  SEEK IMMEDIATE MEDICAL CARE IF:   You pass out.   You are changing pads every 15 to 30 minutes.   You have abdominal pain.  You have a fever.   You become sweaty or weak.   You are passing large blood clots from the vagina.   You start to feel nauseous and vomit. MAKE SURE YOU:   Understand these instructions.  Will watch your condition.  Will get help right away if you are not doing well or get worse.   This information is not intended to replace advice given to you by your health care provider. Make sure you discuss any questions you have with your health care provider.   Document  Released: 03/06/2005 Document Revised: 03/11/2013 Document Reviewed: 10/03/2012 Elsevier Interactive Patient Education [employer]

## 2015-04-12 LAB — CYTOLOGY - PAP

## 2015-04-14 ENCOUNTER — Telehealth: Payer: Self-pay | Admitting: General Practice

## 2015-04-14 DIAGNOSIS — N938 Other specified abnormal uterine and vaginal bleeding: Secondary | ICD-10-CM

## 2015-04-14 MED ORDER — NORETHINDRONE ACET-ETHINYL EST 1-20 MG-MCG PO TABS: 1.0000 | ORAL_TABLET | Freq: Every day | ORAL | Status: DC

## 2015-04-14 NOTE — Telephone Encounter (Signed)
Patient's pap & endo bx were negative. Patient should follow up in 3months

## 2015-04-14 NOTE — Telephone Encounter (Signed)
Patient called and left message stating she is requesting a Rx change. Patient states the medication is too expensive and was told to call us if that happened. Patient would also like to know when to start the medication & requested call back at work or cell number. Per Wynelle Bourgeois, may change to junel Rx and patient can begin at any time. Called patient on mobile number and line went straight to voicemail. Unable to leave message due to VM full. Called patient at work and she was not in at that time.

## 2015-04-16 ENCOUNTER — Encounter: Payer: Self-pay | Admitting: General Practice

## 2015-04-16 NOTE — Telephone Encounter (Signed)
Called patient, no answer- unable to leave message due to voicemail box full. Patient did not answer work number. Left message with patient's mother to call us back. Will send letter

## 2015-04-19 ENCOUNTER — Telehealth: Payer: Self-pay

## 2015-04-19 MED ORDER — NORGESTIMATE-ETH ESTRADIOL 0.25-35 MG-MCG PO TABS: 1.0000 | ORAL_TABLET | Freq: Every day | ORAL | Status: AC

## 2015-04-19 NOTE — Telephone Encounter (Signed)
Pt called requesting results. Called pt and pt informed me that the BCP that was prescribed was to expensive can she get another Rx.  Per Dr. Debroah Loop, pt can have Sprintec instead of LoEstrin.  Prescription e-prescribed.  I also informed pt normal results.  Pt stated understanding.

## 2015-06-09 ENCOUNTER — Emergency Department (INDEPENDENT_AMBULATORY_CARE_PROVIDER_SITE_OTHER)
Admission: EM | Admit: 2015-06-09 | Discharge: 2015-06-09 | Disposition: A | Discharge status: Home / Self Care | Payer: 59 | Source: Home / Self Care | Attending: Emergency Medicine | Admitting: Emergency Medicine

## 2015-06-09 ENCOUNTER — Other Ambulatory Visit (HOSPITAL_COMMUNITY)
Admission: RE | Admit: 2015-06-09 | Discharge: 2015-06-09 | Disposition: A | Discharge status: Home / Self Care | Payer: 59 | Source: Ambulatory Visit | Attending: Emergency Medicine | Admitting: Emergency Medicine

## 2015-06-09 ENCOUNTER — Encounter (HOSPITAL_COMMUNITY): Payer: Self-pay | Admitting: *Deleted

## 2015-06-09 DIAGNOSIS — J029 Acute pharyngitis, unspecified: Secondary | ICD-10-CM | POA: Insufficient documentation

## 2015-06-09 LAB — POCT RAPID STREP A: Streptococcus, Group A Screen (Direct): NEGATIVE

## 2015-06-09 MED ORDER — AMOXICILLIN 500 MG PO CAPS: 500.0000 mg | ORAL_CAPSULE | Freq: Three times a day (TID) | ORAL | Status: AC

## 2015-06-09 NOTE — ED Notes (Signed)
Pt  Reports  Symptoms  Of  sorethroat         With headache  And  Swollen  Glands         With  Symptoms  For  Several days        symptoms  Not  releived  By  otc  meds

## 2015-06-09 NOTE — Discharge Instructions (Signed)
Sore Throat °A sore throat is a painful, burning, sore, or scratchy feeling of the throat. There may be pain or tenderness when swallowing or talking. You may have other symptoms with a sore throat. These include coughing, sneezing, fever, or a swollen neck. A sore throat is often the first sign of another sickness. These sicknesses may include a cold, flu, strep throat, or an infection called mono. Most sore throats go away without medical treatment.  °HOME CARE  °· Only take medicine as told by your doctor. °· Drink enough fluids to keep your pee (urine) clear or pale yellow. °· Rest as needed. °· Try using throat sprays, lozenges, or suck on hard candy (if older than 4 years or as told). °· Sip warm liquids, such as broth, herbal tea, or warm water with honey. Try sucking on frozen ice pops or drinking cold liquids. °· Rinse the mouth (gargle) with salt water. Mix 1 teaspoon salt with 8 ounces of water. °· Do not smoke. Avoid being around others when they are smoking. °· Put a humidifier in your bedroom at night to moisten the air. You can also turn on a hot shower and sit in the bathroom for 5-10 minutes. Be sure the bathroom door is closed. °GET HELP RIGHT AWAY IF:  °· You have trouble breathing. °· You cannot swallow fluids, soft foods, or your spit (saliva). °· You have more puffiness (swelling) in the throat. °· Your sore throat does not get better in 7 days. °· You feel sick to your stomach (nauseous) and throw up (vomit). °· You have a fever or lasting symptoms for more than 2-3 days. °· You have a fever and your symptoms suddenly get worse. °MAKE SURE YOU:  °· Understand these instructions. °· Will watch your condition. °· Will get help right away if you are not doing well or get worse. °  °This information is not intended to replace advice given to you by your health care provider. Make sure you discuss any questions you have with your health care provider. °  °Document Released: 12/14/2007 Document  Revised: 11/29/2011 Document Reviewed: 11/12/2011 °Elsevier Interactive Patient Education ©2016 [employer] Inc. ° °Pharyngitis °Pharyngitis is redness, pain, and swelling (inflammation) of your pharynx.  °CAUSES  °Pharyngitis is usually caused by infection. Most of the time, these infections are from viruses (viral) and are part of a cold. However, sometimes pharyngitis is caused by bacteria (bacterial). Pharyngitis can also be caused by allergies. Viral pharyngitis may be spread from person to person by coughing, sneezing, and personal items or utensils (cups, forks, spoons, toothbrushes). Bacterial pharyngitis may be spread from person to person by more intimate contact, such as kissing.  °SIGNS AND SYMPTOMS  °Symptoms of pharyngitis include:   °· Sore throat.   °· Tiredness (fatigue).   °· Low-grade fever.   °· Headache. °· Joint pain and muscle aches. °· Skin rashes. °· Swollen lymph nodes. °· Plaque-like film on throat or tonsils (often seen with bacterial pharyngitis). °DIAGNOSIS  °Your health care provider will ask you questions about your illness and your symptoms. Your medical history, along with a physical exam, is often all that is needed to diagnose pharyngitis. Sometimes, a rapid strep test is done. Other lab tests may also be done, depending on the suspected cause.  °TREATMENT  °Viral pharyngitis will usually get better in 3-4 days without the use of medicine. Bacterial pharyngitis is treated with medicines that kill germs (antibiotics).  °HOME CARE INSTRUCTIONS  °· Drink enough water and fluids to   keep your urine clear or pale yellow.   °· Only take over-the-counter or prescription medicines as directed by your health care provider:   °¨ If you are prescribed antibiotics, make sure you finish them even if you start to feel better.   °¨ Do not take aspirin.   °· Get lots of rest.   °· Gargle with 8 oz of salt water (½ tsp of salt per 1 qt of water) as often as every 1-2 hours to soothe your throat.    °· Throat lozenges (if you are not at risk for choking) or sprays may be used to soothe your throat. °SEEK MEDICAL CARE IF:  °· You have large, tender lumps in your neck. °· You have a rash. °· You cough up green, yellow-brown, or bloody spit. °SEEK IMMEDIATE MEDICAL CARE IF:  °· Your neck becomes stiff. °· You drool or are unable to swallow liquids. °· You vomit or are unable to keep medicines or liquids down. °· You have severe pain that does not go away with the use of recommended medicines. °· You have trouble breathing (not caused by a stuffy nose). °MAKE SURE YOU:  °· Understand these instructions. °· Will watch your condition. °· Will get help right away if you are not doing well or get worse. °  °This information is not intended to replace advice given to you by your health care provider. Make sure you discuss any questions you have with your health care provider. °  °Document Released: 03/06/2005 Document Revised: 12/25/2012 Document Reviewed: 11/11/2012 °Elsevier Interactive Patient Education ©2016 [employer] Inc. ° °

## 2015-06-09 NOTE — ED Provider Notes (Signed)
CSN: [id number]     Arrival date & time 06/09/15  1418 History   First MD Initiated Contact with Patient 06/09/15 1618     Chief Complaint  Patient presents with  . Sore Throat   (Consider location/radiation/quality/duration/timing/severity/associated sxs/prior Treatment) HPI History obtained from patient:   LOCATION:throat SEVERITY:6 DURATION:since Friday CONTEXT:sudden onset QUALITY:scratchy MODIFYING FACTORS:OTC meds without relief ASSOCIATED SYMPTOMS:hurts to swallow, fever, runny nose, cough TIMING:now constant Grandbaby due any day History reviewed. No pertinent past medical history. History reviewed. No pertinent past surgical history. Family History  Problem Relation Age of Onset  . Hypertension Mother   . Heart disease Father   . Heart disease Brother    Social History  Substance Use Topics  . Smoking status: Never Smoker   . Smokeless tobacco: None  . Alcohol Use: No   OB History    No data available     Review of Systems Sore throat Allergies  Review of patient's allergies indicates no known allergies.  Home Medications   Prior to Admission medications   Medication Sig Start Date End Date Taking? Authorizing Provider  amoxicillin (AMOXIL) 500 MG capsule Take 1 capsule (500 mg total) by mouth 3 (three) times daily. 06/09/15   Tharon AquasFrank C Jeanluc Wegman, PA  ibuprofen (ADVIL,MOTRIN) 800 MG tablet Take 1 tablet (800 mg total) by mouth 3 (three) times daily. 03/22/15   Garlon HatchetLisa M Sanders, PA-C  norgestimate-ethinyl estradiol (ORTHO-CYCLEN,SPRINTEC,PREVIFEM) 0.25-35 MG-MCG tablet Take 1 tablet by mouth daily. 04/19/15   Adam PhenixJames G Arnold, MD  ondansetron (ZOFRAN ODT) 4 MG disintegrating tablet Take 1 tablet (4 mg total) by mouth every 8 (eight) hours as needed for nausea. Patient not taking: Reported on 04/08/2015 03/22/15   Garlon HatchetLisa M Sanders, PA-C  oxyCODONE-acetaminophen (PERCOCET/ROXICET) 5-325 MG tablet Take 1 tablet by mouth every 4 (four) hours as needed. Patient not taking:  Reported on 04/08/2015 03/22/15   Garlon HatchetLisa M Sanders, PA-C   Meds Ordered and Administered this Visit  Medications - No data to display  BP 139/80 mmHg  Pulse 83  Temp(Src) 98.6 F (37 C) (Oral)  Resp 16  SpO2 96% No data found.   Physical Exam NURSES NOTES AND VITAL SIGNS REVIEWED. CONSTITUTIONAL: Well developed, well nourished, no acute distress HEENT: normocephalic, atraumatic, right and left TM's are normal, Throat clear without tonsils.  EYES: Conjunctiva normal NECK:normal ROM, supple, no adenopathy PULMONARY:No respiratory distress, normal effort, Lungs: CTAb/l, no wheezes, or increased work of breathing CARDIOVASCULAR: RRR, no murmur ABDOMEN: soft, ND, NT, +'ve BS MUSCULOSKELETAL: Normal ROM of all extremities,  SKIN: warm and dry without rash PSYCHIATRIC: Mood and affect, behavior are normal  ED Course  Procedures (including critical care time)  Labs Review Labs Reviewed  POCT RAPID STREP A    Imaging Review No results found.   Visual Acuity Review  Right Eye Distance:   Left Eye Distance:   Bilateral Distance:    Right Eye Near:   Left Eye Near:    Bilateral Near:       RBS neg RX for amoxil given  MDM   1. Pharyngitis      Patient is reassured that there are no issues that require transfer to higher level of care at this time or additional tests. Patient is advised to continue home symptomatic treatment. Patient is advised that if there are new or worsening symptoms to attend the emergency department, contact primary care provider, or return to [facility]. Instructions of care provided discharged home in stable condition. Return to work/school  note provided.   THIS NOTE WAS GENERATED USING A VOICE RECOGNITION SOFTWARE PROGRAM. ALL REASONABLE EFFORTS  WERE MADE TO PROOFREAD THIS DOCUMENT FOR ACCURACY.  I have verbally reviewed the discharge instructions with the patient. A printed AVS was given to the patient.  All questions were answered prior to  discharge.      Tharon Aquas, PA 06/09/15 [phone]

## 2015-06-12 LAB — CULTURE, GROUP A STREP (THRC)

## 2016-10-10 IMAGING — US US PELVIS COMPLETE
1 series · 13 of 25 positions shown · non-contrast
Comparison: None.

CLINICAL DATA: Heavy bleeding and pelvic pain since yesterday.

EXAM:
TRANSABDOMINAL AND TRANSVAGINAL ULTRASOUND OF PELVIS
DOPPLER ULTRASOUND OF OVARIES
TECHNIQUE: Both transabdominal and transvaginal ultrasound examinations of the
pelvis were performed. Transabdominal technique was performed for
global imaging of the pelvis including uterus, ovaries, adnexal
regions, and pelvic cul-de-sac.
It was necessary to proceed with endovaginal exam following the
transabdominal exam to visualize the endometrium. Color and duplex
Doppler ultrasound was utilized to evaluate blood flow to the
ovaries.

[Series 1: us pelvis complete · 0.28mm/px · 60 acquisitions, 13 frames shown]
[im 1/60]
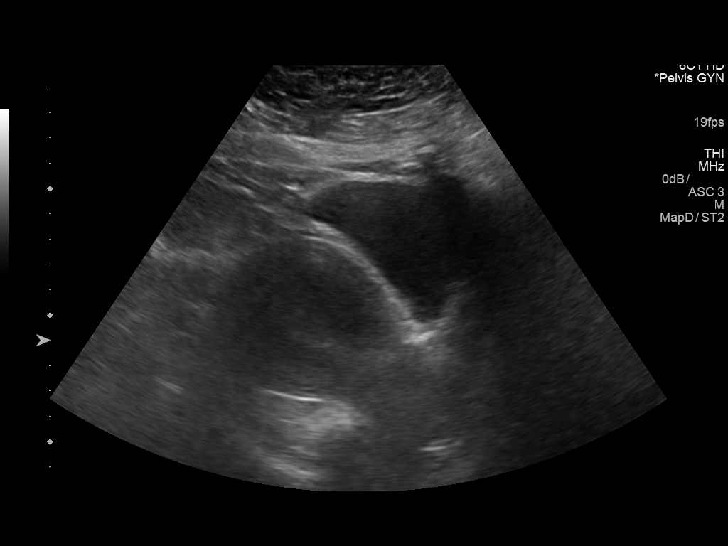
[im 5/60]
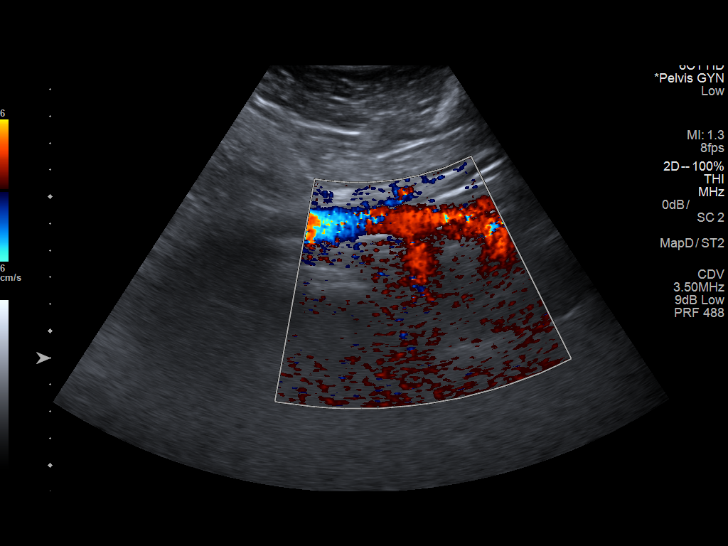
[im 10/60]
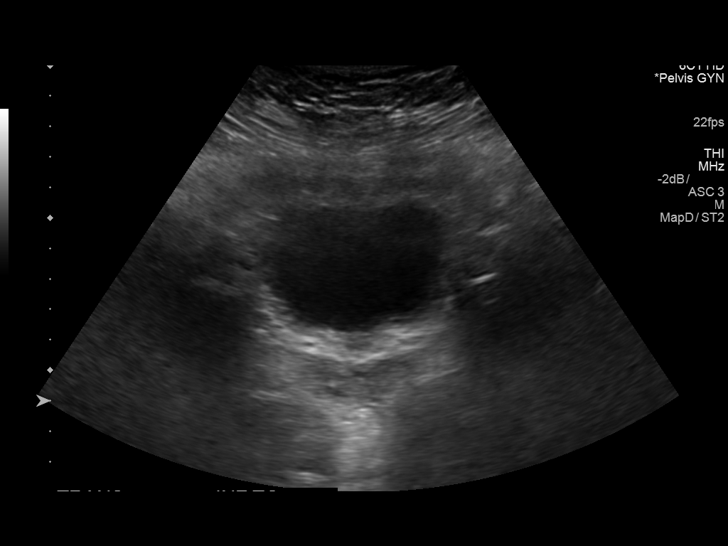
[im 15/60]
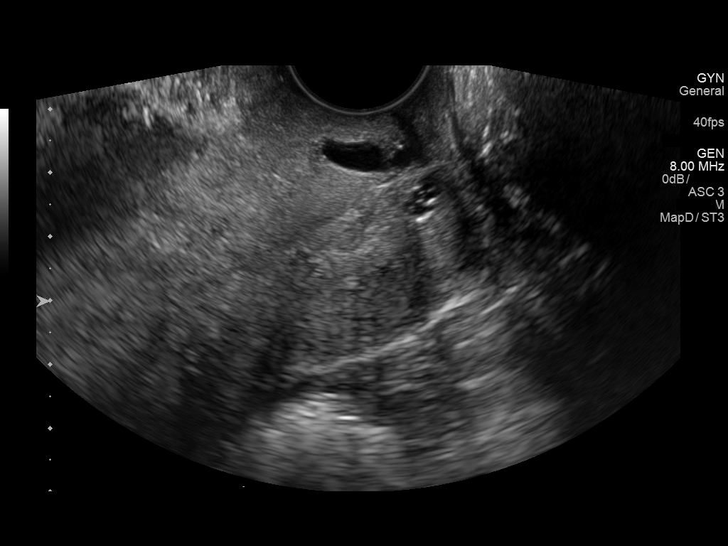
[im 20/60]
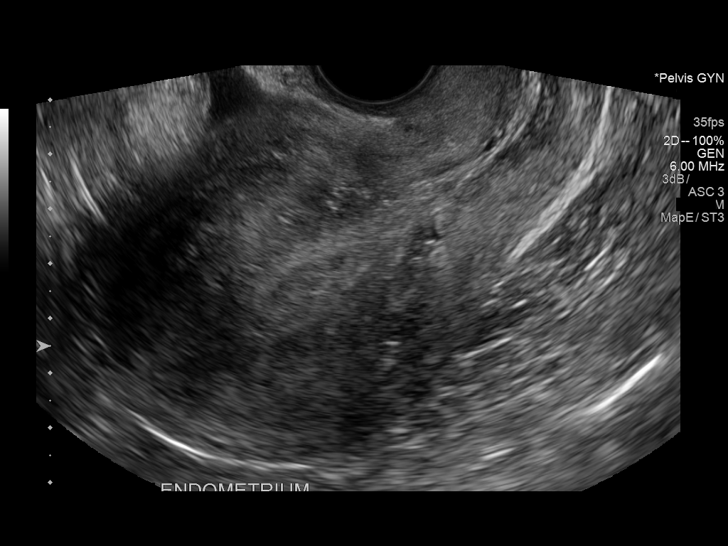
[im 25/60]
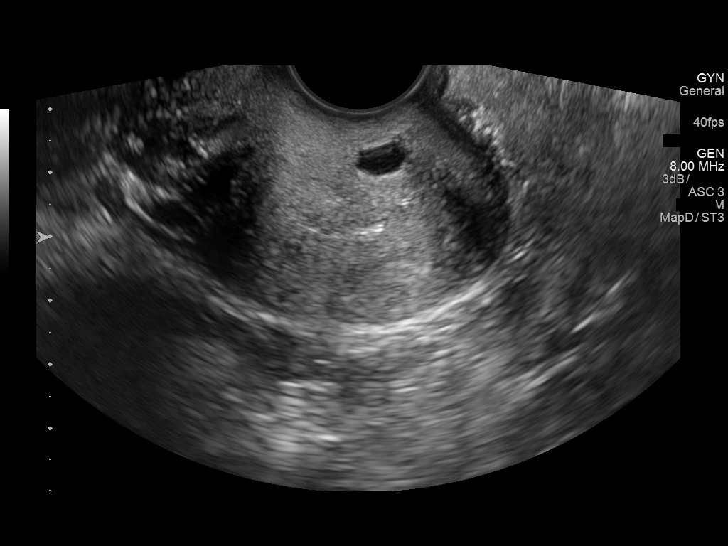
[im 30/60]
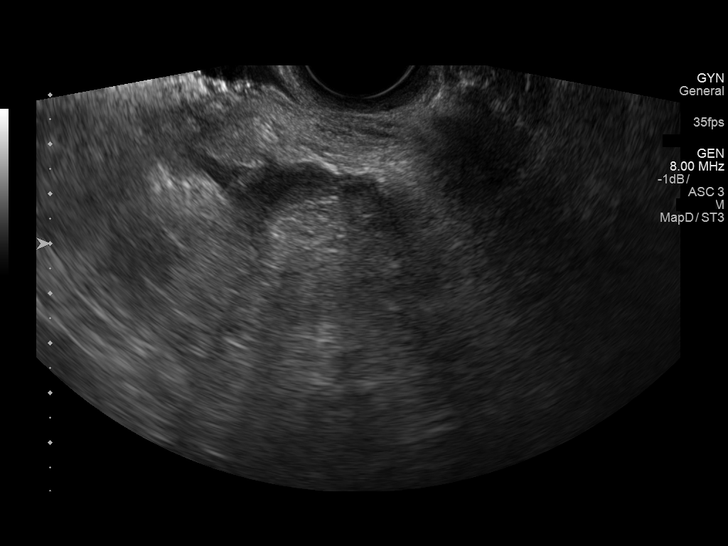
[im 35/60]
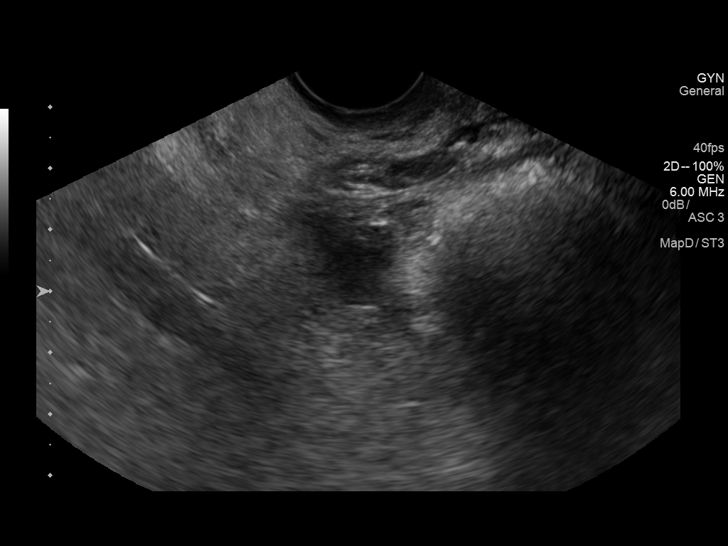
[im 40/60]
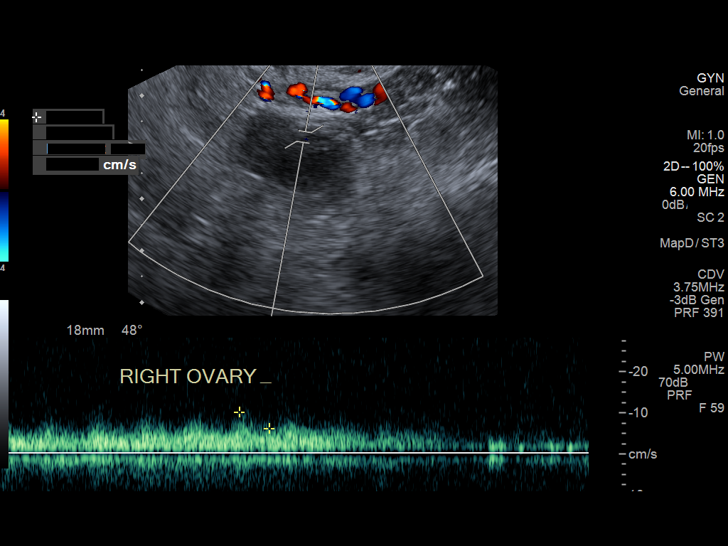
[im 45/60]
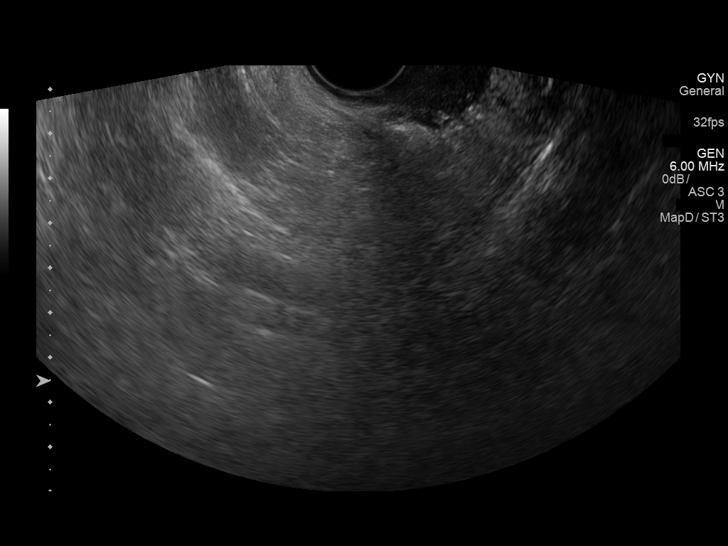
[im 50/60]
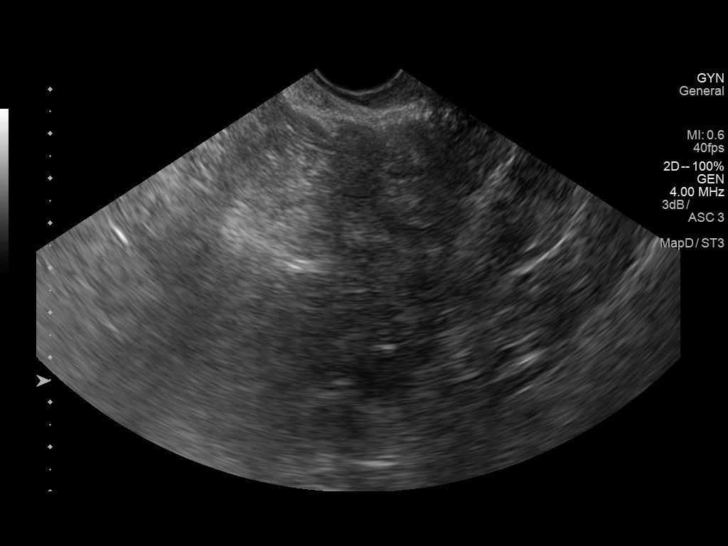
[im 55/60]
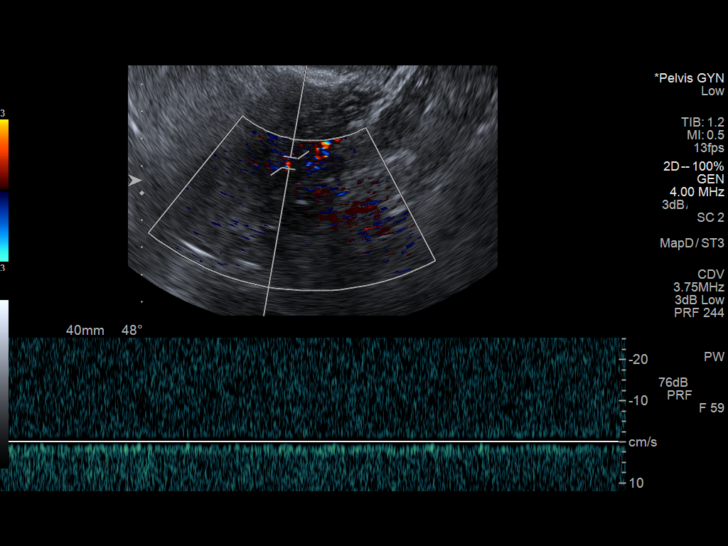
[im 60/60]
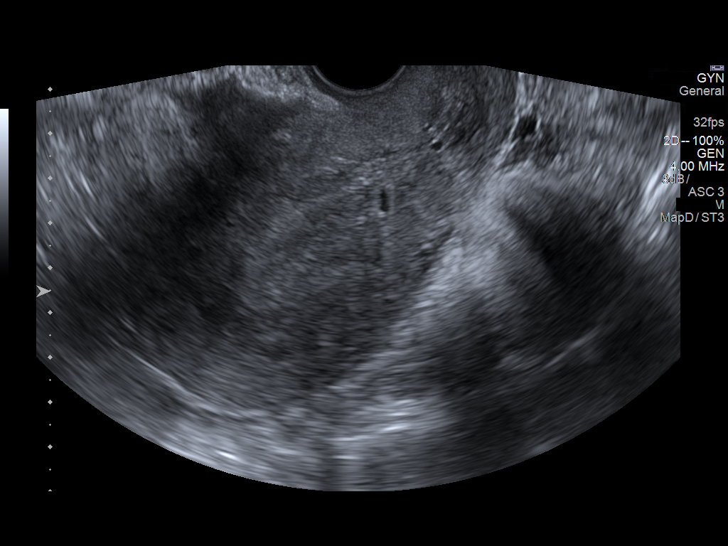

[13 of 25 positions shown; findings below may reference images not displayed]

FINDINGS: Uterus

Measurements: 11.9 x 6 x 6 x 7.3 cm. Myometrium is heterogeneous
raising the question of adenomyosis. No evidence for focal fibroid.

Endometrium

Thickness: Heterogeneous and 15 mm in thickness.. No focal
abnormality visualized.

Right ovary

Measurements: Poorly visualized but approximately 3.1 x 2.2 x
cm. No adnexal mass.

Left ovary

Measurements: Not definitely visualized..

Pulsed Doppler evaluation of ovaries demonstrates normal low
resistance arterial and venous waveforms in the right ovary. Left
ovary not well seen.

Other findings

Trace intraperitoneal free fluid.
IMPRESSION: Thickened heterogeneous endometrium may be related to blood
products.

No evidence for adnexal mass with poor visualization of left ovary.

## 2024-04-01 ENCOUNTER — Other Ambulatory Visit: Payer: Self-pay | Admitting: Medical Genetics
